# Patient Record
Sex: Male | Born: 1995 | Race: Black or African American | Hispanic: No | Marital: Single
Health system: Southern US, Community
[De-identification: ages and names within clinical notes are randomized; demographics above are authoritative.]

---
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
## 2021-12-29 ENCOUNTER — Ambulatory Visit: Payer: Self-pay | Admitting: Podiatry

## 2022-01-10 ENCOUNTER — Ambulatory Visit (INDEPENDENT_AMBULATORY_CARE_PROVIDER_SITE_OTHER): Payer: Medicaid Other | Admitting: Podiatry

## 2022-01-10 ENCOUNTER — Encounter: Payer: Self-pay | Admitting: Podiatry

## 2022-01-10 ENCOUNTER — Ambulatory Visit (INDEPENDENT_AMBULATORY_CARE_PROVIDER_SITE_OTHER): Payer: Medicaid Other

## 2022-01-10 DIAGNOSIS — M2011 Hallux valgus (acquired), right foot: Secondary | ICD-10-CM

## 2022-01-10 NOTE — Progress Notes (Signed)
? ?  Subjective: 26 y.o. male presents today as a new patient with his care provider, Arlys John, for evaluation of a symptomatic bunion to the right foot.  Patient recently moved from [facility], [place].  Apparently the patient was scheduled to have surgery prior to his move however when he moved he had to get reestablished here in [place].  He was referred from the PCP for evaluation of a symptomatic bunion deformity.  Presents for further treatment and evaluation ? ?No past medical history on file. ? ?No Known Allergies ? ?Objective: ?Physical Exam ?General: The patient is alert and oriented x3 in no acute distress. ? ?Dermatology: Skin is cool, dry and supple bilateral lower extremities. Negative for open lesions or macerations. ? ?Vascular: Palpable pedal pulses bilaterally. No edema or erythema noted. Capillary refill within normal limits. ? ?Neurological: Epicritic and protective threshold grossly intact bilaterally.  ? ?Musculoskeletal Exam: Clinical evidence of bunion deformity noted to the respective foot. There is moderate pain on palpation range of motion of the first MPJ. Lateral deviation of the hallux noted consistent with hallux abductovalgus. ? ?Radiographic Exam: Increased intermetatarsal angle greater than 15? with a hallux abductus angle greater than 30? noted on AP view. Moderate degenerative changes noted within the first MPJ. ? ?Assessment: ?1. HAV w/ bunion deformity right ? ? ?Plan of Care:  ?1. Patient was evaluated. X-Rays reviewed. ?2.  The patient has tried conservative treatment modalities including wider fitting shoes and arch supports with no improvement.  Conservative options have been exhausted and he was actually planning for surgery prior to his move however when he moved he had to reestablish care here in [place].   ?3.  Today we discussed the conservative versus surgical management of the presenting pathology. The patient and caregiver/guardian opts for surgical management. All  possible complications and details of the procedure were explained. All patient questions were answered. No guarantees were expressed or implied. ?4. Authorization for surgery was initiated today. Surgery will consist of bunionectomy with first metatarsal osteotomy right ?5.  Patient will require medical clearance from PCP ?6.  Return to clinic 1 week postop ? ?   ? ? ?Felecia Shelling, DPM ?[facility] ? ?Dr. Felecia Shelling, DPM  ?  ?[address]                                        ?[place], [place] [address]                ?Office [phone]  ?Fax [phone] ? ? ? ? ? ?

## 2022-01-27 ENCOUNTER — Encounter: Payer: Self-pay | Admitting: Podiatry

## 2022-01-27 ENCOUNTER — Other Ambulatory Visit: Payer: Self-pay | Admitting: Podiatry

## 2022-01-27 DIAGNOSIS — M2011 Hallux valgus (acquired), right foot: Secondary | ICD-10-CM | POA: Diagnosis not present

## 2022-01-27 MED ORDER — IBUPROFEN 800 MG PO TABS: 800.0000 mg | ORAL_TABLET | Freq: Three times a day (TID) | ORAL | 1 refills | Status: AC

## 2022-01-27 MED ORDER — OXYCODONE-ACETAMINOPHEN 5-325 MG PO TABS: 1.0000 | ORAL_TABLET | ORAL | 0 refills | Status: DC | PRN

## 2022-01-27 NOTE — Progress Notes (Signed)
PRN postop 

## 2022-02-02 ENCOUNTER — Ambulatory Visit (INDEPENDENT_AMBULATORY_CARE_PROVIDER_SITE_OTHER): Payer: Medicaid Other | Admitting: Podiatry

## 2022-02-02 ENCOUNTER — Ambulatory Visit (INDEPENDENT_AMBULATORY_CARE_PROVIDER_SITE_OTHER): Payer: Medicaid Other

## 2022-02-02 DIAGNOSIS — M2011 Hallux valgus (acquired), right foot: Secondary | ICD-10-CM | POA: Diagnosis not present

## 2022-02-02 DIAGNOSIS — Z9889 Other specified postprocedural states: Secondary | ICD-10-CM

## 2022-02-02 NOTE — Progress Notes (Signed)
   Subjective:  Patient presents today status post bunionectomy with first metatarsal osteotomy right. DOS: 01/27/2022.  Patient states that he is doing well.  Pain is controlled with the pain medication.  Patient's care provider today states that he is doing well although he does admit to excessively walking throughout the house.  No past medical history on file.  No Known Allergies   Objective/Physical Exam Neurovascular status intact.  Skin incisions appear to be well coapted with staples intact. No sign of infectious process noted. No dehiscence. No active bleeding noted. Moderate edema noted to the surgical extremity.  Radiographic Exam:  Orthopedic hardware and osteotomies sites appear to be stable with routine healing.  Assessment: 1. s/p bunionectomy with first metatarsal osteotomy right. DOS: 01/27/2022   Plan of Care:  1. Patient was evaluated. X-rays reviewed 2.  Dressings changed.  Clean dry and intact x1 week 3.  Continue minimal weightbearing in the cam boot.  Stressed the importance of reducing his activity and staying off of his foot is much as reasonably possible 4.  Continue Percocet 5/325 mg as needed pain 5.  Return to clinic 1 week for staple removal   Felecia Shelling, DPM [facility]  Dr. Felecia Shelling, DPM    [address] [place], [place] [address]                Office [phone]  Fax [phone]

## 2022-02-09 ENCOUNTER — Other Ambulatory Visit: Payer: Self-pay | Admitting: Podiatry

## 2022-02-09 ENCOUNTER — Encounter: Payer: Medicaid Other | Admitting: Podiatry

## 2022-02-09 NOTE — Telephone Encounter (Signed)
Please advise 

## 2022-02-10 MED ORDER — OXYCODONE-ACETAMINOPHEN 5-325 MG PO TABS: 1.0000 | ORAL_TABLET | ORAL | 0 refills | Status: DC | PRN

## 2022-02-11 ENCOUNTER — Other Ambulatory Visit: Payer: Self-pay | Admitting: Podiatry

## 2022-02-11 MED ORDER — OXYCODONE-ACETAMINOPHEN 5-325 MG PO TABS: 1.0000 | ORAL_TABLET | Freq: Four times a day (QID) | ORAL | 0 refills | Status: AC | PRN

## 2022-02-23 ENCOUNTER — Ambulatory Visit (INDEPENDENT_AMBULATORY_CARE_PROVIDER_SITE_OTHER): Payer: Medicaid Other | Admitting: Podiatry

## 2022-02-23 ENCOUNTER — Ambulatory Visit (INDEPENDENT_AMBULATORY_CARE_PROVIDER_SITE_OTHER): Payer: Medicaid Other

## 2022-02-23 DIAGNOSIS — M2011 Hallux valgus (acquired), right foot: Secondary | ICD-10-CM | POA: Diagnosis not present

## 2022-02-23 DIAGNOSIS — Z9889 Other specified postprocedural states: Secondary | ICD-10-CM

## 2022-02-23 NOTE — Progress Notes (Signed)
   Subjective:  Patient presents today status post bunionectomy with first metatarsal osteotomy right. DOS: 01/27/2022.  Patient doing well.  He has been wearing the cam boot as instructed.  No new complaints at this time  No past medical history on file.  No Known Allergies   Objective/Physical Exam Neurovascular status intact.  Skin incisions appear to be well coapted with staples intact. No sign of infectious process noted. No dehiscence. No active bleeding noted.  No edema.  Clinically the toe is in a good rectus position  Radiographic Exam:  There does appear to have been some translocation and movement of the screws within the first metatarsal however there continues to be good alignment of the first ray.  Assessment: 1. s/p bunionectomy with first metatarsal osteotomy right. DOS: 01/27/2022   Plan of Care:  1. Patient was evaluated. X-rays reviewed.  We will keep the patient in the cam boot for an additional 3 weeks 2.  Staples removed.  Patient may begin washing and showering getting the foot wet 3.  Continue weightbearing in the cam boot for 3 additional weeks 4.  Return to clinic in 3 weeks for follow-up x-ray and to transition the patient out of the surgical boot into good supportive sneakers and shoes 5.  Return to clinic 3 weeks for follow-up x-ray   Felecia Shelling, DPM [facility]  Dr. Felecia Shelling, DPM    [address] [place], [place] [address]                Office [phone]  Fax [phone]

## 2022-03-16 ENCOUNTER — Ambulatory Visit (INDEPENDENT_AMBULATORY_CARE_PROVIDER_SITE_OTHER): Payer: Medicaid Other | Admitting: Podiatry

## 2022-03-16 ENCOUNTER — Ambulatory Visit (INDEPENDENT_AMBULATORY_CARE_PROVIDER_SITE_OTHER): Payer: Medicaid Other

## 2022-03-16 DIAGNOSIS — M2011 Hallux valgus (acquired), right foot: Secondary | ICD-10-CM

## 2022-03-16 DIAGNOSIS — Z9889 Other specified postprocedural states: Secondary | ICD-10-CM

## 2022-03-16 NOTE — Progress Notes (Signed)
   Subjective:  Patient presents today status post bunionectomy with first metatarsal osteotomy right. DOS: 01/27/2022.  Patient continues to do well.  He says that he has been weightbearing in the cam boot.  No new complaints at this time.  He presents today with his caregiver  No past medical history on file.  No Known Allergies   Objective/Physical Exam Neurovascular status intact.  Skin incisions nicely healed.  No edema.  Clinically the toe is in a good rectus position.  The patient has no pain associated to the surgical foot.  Range of motion within normal.  Radiographic Exam:  There continues to be some slight fragmentation of the osteotomy site and shortening of the first metatarsal in general.  Slight elevatus also noted on lateral view of the first metatarsal osteotomy site.  Assessment: 1. s/p bunionectomy with first metatarsal osteotomy right. DOS: 01/27/2022   Plan of Care:  1. Patient was evaluated. X-rays reviewed.   2.  The patient is now almost 2 months postop.  He may transition out of the cam boot into good supportive shoes and sneakers. 3.  Continue reduced activity and minimal ambulating 4.  Return to clinic in 4 weeks for final follow-up x-ray  Felecia Shelling, DPM [facility]  Dr. Felecia Shelling, DPM    [address] [place], [place] [address]                Office [phone]  Fax [phone]

## 2022-04-27 ENCOUNTER — Ambulatory Visit (INDEPENDENT_AMBULATORY_CARE_PROVIDER_SITE_OTHER): Payer: Medicaid Other

## 2022-04-27 ENCOUNTER — Ambulatory Visit (INDEPENDENT_AMBULATORY_CARE_PROVIDER_SITE_OTHER): Payer: Medicaid Other | Admitting: Podiatry

## 2022-04-27 DIAGNOSIS — M2011 Hallux valgus (acquired), right foot: Secondary | ICD-10-CM | POA: Diagnosis not present

## 2022-04-27 NOTE — Progress Notes (Signed)
   Chief Complaint  Patient presents with   Post-op Follow-up     POV #5 DOS 01/27/2022 BUNIONECTOMY W/OSTEOTOMY RT    Subjective:  Patient presents today status post bunionectomy with first metatarsal osteotomy right. DOS: 01/27/2022.  Patient states that he is doing very well.  He is ambulating just fine in tennis shoes with no  No past medical history on file.  No Known Allergies   Objective/Physical Exam Neurovascular status intact.  Skin incisions nicely healed.  No edema.  Clinically the toe is in a good rectus position.  The patient continues to state that he has no associated pain to the surgical foot.  He has been full activity no restrictions in tennis shoes with no complications or symptoms  Radiographic Exam RT foot 04/27/2022:  Mostly unchanged since prior x-rays taken there continues to be fragmentation of the osteotomy site and shortening of the first metatarsal in general.  Slight elevatus also noted on lateral view of the first metatarsal osteotomy site.  Assessment: 1. s/p bunionectomy with first metatarsal osteotomy right. DOS: 01/27/2022   Plan of Care:  1. Patient was evaluated. X-rays reviewed.   2.  Again, the patient states that he has no pain or tenderness associated to the surgical foot.  He has been walking in good supportive sneakers in tennis shoes without any complications or pain or swelling. 3.  The patient is now over 3 months postop.  He may continue wearing supportive tennis shoes and sneakers 4.  We will continue to observe for now.  Return to clinic as needed, or if he begins to notice swelling or symptoms to the surgical foot  Felecia Shelling, DPM [facility]  Dr. Felecia Shelling, DPM    [address] [place], [place] [address]                Office [phone]  Fax [phone]

## 2022-05-26 ENCOUNTER — Emergency Department (HOSPITAL_COMMUNITY): Payer: No Typology Code available for payment source

## 2022-05-26 ENCOUNTER — Encounter (HOSPITAL_COMMUNITY): Payer: Self-pay

## 2022-05-26 ENCOUNTER — Emergency Department (HOSPITAL_COMMUNITY)
Admission: EM | Admit: 2022-05-26 | Discharge: 2022-05-30 | Disposition: A | Discharge status: Home / Self Care | Payer: No Typology Code available for payment source | Attending: Emergency Medicine | Admitting: Emergency Medicine

## 2022-05-26 ENCOUNTER — Other Ambulatory Visit: Payer: Self-pay

## 2022-05-26 DIAGNOSIS — R45850 Homicidal ideations: Secondary | ICD-10-CM | POA: Diagnosis not present

## 2022-05-26 DIAGNOSIS — Z20822 Contact with and (suspected) exposure to covid-19: Secondary | ICD-10-CM | POA: Insufficient documentation

## 2022-05-26 DIAGNOSIS — R7989 Other specified abnormal findings of blood chemistry: Secondary | ICD-10-CM | POA: Diagnosis not present

## 2022-05-26 DIAGNOSIS — F4310 Post-traumatic stress disorder, unspecified: Secondary | ICD-10-CM | POA: Diagnosis not present

## 2022-05-26 DIAGNOSIS — D649 Anemia, unspecified: Secondary | ICD-10-CM | POA: Diagnosis not present

## 2022-05-26 DIAGNOSIS — M549 Dorsalgia, unspecified: Secondary | ICD-10-CM | POA: Insufficient documentation

## 2022-05-26 DIAGNOSIS — F250 Schizoaffective disorder, bipolar type: Secondary | ICD-10-CM | POA: Diagnosis present

## 2022-05-26 LAB — CBC WITH DIFFERENTIAL/PLATELET
Abs Immature Granulocytes: 0.02 10*3/uL (ref 0.00–0.07)
Basophils Absolute: 0 10*3/uL (ref 0.0–0.1)
Basophils Relative: 0 %
Eosinophils Absolute: 0 10*3/uL (ref 0.0–0.5)
Eosinophils Relative: 0 %
HCT: 35.8 % — ABNORMAL LOW (ref 39.0–52.0)
Hemoglobin: 10.6 g/dL — ABNORMAL LOW (ref 13.0–17.0)
Immature Granulocytes: 0 %
Lymphocytes Relative: 13 %
Lymphs Abs: 1.4 10*3/uL (ref 0.7–4.0)
MCH: 26.4 pg (ref 26.0–34.0)
MCHC: 29.6 g/dL — ABNORMAL LOW (ref 30.0–36.0)
MCV: 89.1 fL (ref 80.0–100.0)
Monocytes Absolute: 0.9 10*3/uL (ref 0.1–1.0)
Monocytes Relative: 8 %
Neutro Abs: 8.1 10*3/uL — ABNORMAL HIGH (ref 1.7–7.7)
Neutrophils Relative %: 79 %
Platelets: 338 10*3/uL (ref 150–400)
RBC: 4.02 MIL/uL — ABNORMAL LOW (ref 4.22–5.81)
RDW: 15.3 % (ref 11.5–15.5)
WBC: 10.4 10*3/uL (ref 4.0–10.5)
nRBC: 0 % (ref 0.0–0.2)

## 2022-05-26 LAB — SALICYLATE LEVEL: Salicylate Lvl: <7 mg/dL — ABNORMAL LOW (ref 7.0–30.0)

## 2022-05-26 LAB — ETHANOL: Alcohol, Ethyl (B): <10 mg/dL (ref <10)

## 2022-05-26 LAB — ACETAMINOPHEN LEVEL: Acetaminophen (Tylenol), Serum: <10 ug/mL — ABNORMAL LOW (ref 10–30)

## 2022-05-26 NOTE — ED Triage Notes (Addendum)
BIB [employer] as a Marcus Conley. PD states he was at [place] and was saying he had a bomb and was going to kill himself and other people. Won't tell anyone what is name is, where he lives. 

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
## 2022-05-27 DIAGNOSIS — F4310 Post-traumatic stress disorder, unspecified: Secondary | ICD-10-CM | POA: Diagnosis present

## 2022-05-27 DIAGNOSIS — F250 Schizoaffective disorder, bipolar type: Secondary | ICD-10-CM | POA: Diagnosis present

## 2022-05-27 LAB — COMPREHENSIVE METABOLIC PANEL
ALT: 18 U/L (ref 0–44)
AST: 28 U/L (ref 15–41)
Albumin: 4.3 g/dL (ref 3.5–5.0)
Alkaline Phosphatase: 74 U/L (ref 38–126)
Anion gap: 6 (ref 5–15)
BUN: 12 mg/dL (ref 6–20)
CO2: 26 mmol/L (ref 22–32)
Calcium: 9.8 mg/dL (ref 8.9–10.3)
Chloride: 109 mmol/L (ref 98–111)
Creatinine, Ser: 1.5 mg/dL — ABNORMAL HIGH (ref 0.61–1.24)
GFR, Estimated: 31 mL/min — ABNORMAL LOW (ref >60)
Glucose, Bld: 121 mg/dL — ABNORMAL HIGH (ref 70–99)
Potassium: 3.5 mmol/L (ref 3.5–5.1)
Sodium: 141 mmol/L (ref 135–145)
Total Bilirubin: 0.3 mg/dL (ref 0.3–1.2)
Total Protein: 7.2 g/dL (ref 6.5–8.1)

## 2022-05-27 LAB — RAPID URINE DRUG SCREEN, HOSP PERFORMED
Amphetamines: NOT DETECTED
Barbiturates: NOT DETECTED
Benzodiazepines: NOT DETECTED
Cocaine: NOT DETECTED
Opiates: NOT DETECTED
Tetrahydrocannabinol: NOT DETECTED

## 2022-05-27 LAB — RESP PANEL BY RT-PCR (FLU A&B, COVID) ARPGX2
Influenza A by PCR: NEGATIVE
Influenza B by PCR: NEGATIVE
SARS Coronavirus 2 by RT PCR: NEGATIVE

## 2022-05-27 LAB — LITHIUM LEVEL: Lithium Lvl: 0.7 mmol/L (ref 0.60–1.20)

## 2022-05-27 MED ORDER — LITHIUM CARBONATE 300 MG PO CAPS
600.0000 mg | ORAL_CAPSULE | Freq: Two times a day (BID) | ORAL | Status: DC
  Administered 2022-05-28 – 2022-05-30 (×6): 600 mg via ORAL
  Filled 2022-05-27 (×6): qty 2

## 2022-05-27 MED ORDER — ZIPRASIDONE MESYLATE 20 MG IM SOLR
20.0000 mg | Freq: Once | INTRAMUSCULAR | Status: AC | PRN
  Administered 2022-05-27: 20 mg via INTRAMUSCULAR
  Filled 2022-05-27: qty 20

## 2022-05-27 MED ORDER — ACETAMINOPHEN 500 MG PO TABS
1000.0000 mg | ORAL_TABLET | Freq: Once | ORAL | Status: AC
  Administered 2022-05-27: 1000 mg via ORAL
  Filled 2022-05-27: qty 2

## 2022-05-27 MED ORDER — HALOPERIDOL 5 MG PO TABS
10.0000 mg | ORAL_TABLET | Freq: Two times a day (BID) | ORAL | Status: DC
  Administered 2022-05-27 – 2022-05-30 (×7): 10 mg via ORAL
  Filled 2022-05-27 (×7): qty 2

## 2022-05-27 MED ORDER — ACETAMINOPHEN 325 MG PO TABS
650.0000 mg | ORAL_TABLET | Freq: Four times a day (QID) | ORAL | Status: DC | PRN
  Administered 2022-05-27: 650 mg via ORAL
  Filled 2022-05-27: qty 2

## 2022-05-27 MED ORDER — OLANZAPINE 10 MG PO TABS
10.0000 mg | ORAL_TABLET | Freq: Two times a day (BID) | ORAL | Status: DC
  Administered 2022-05-28 – 2022-05-30 (×6): 10 mg via ORAL
  Filled 2022-05-27 (×6): qty 1

## 2022-05-27 MED ORDER — BENZTROPINE MESYLATE 0.5 MG PO TABS
1.0000 mg | ORAL_TABLET | Freq: Every day | ORAL | Status: DC
  Administered 2022-05-27 – 2022-05-30 (×5): 1 mg via ORAL
  Filled 2022-05-27 (×5): qty 2

## 2022-05-27 MED ORDER — HALOPERIDOL 5 MG PO TABS
5.0000 mg | ORAL_TABLET | Freq: Every day | ORAL | Status: DC
  Filled 2022-05-27 (×2): qty 1

## 2022-05-27 MED ORDER — OLANZAPINE 5 MG PO TABS
5.0000 mg | ORAL_TABLET | Freq: Every day | ORAL | Status: DC
  Administered 2022-05-28 – 2022-05-30 (×3): 5 mg via ORAL
  Filled 2022-05-27 (×3): qty 1

## 2022-05-27 NOTE — ED Notes (Signed)
Gave patient tylenol for lower back pain. Will not give his name.
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
## 2022-05-27 NOTE — Progress Notes (Signed)
TOC CSW received a call from Mannie Stabile with [place] DSS-[facility].  CSW gave report.    [place] Tarpley-Carter, MSW, LCSW-A Pronouns:  She/Her/Hers [facility] HealthTransitions of Care Clinical Social Worker Direct Number:  [phone] [email or web address]

## 2022-05-27 NOTE — ED Notes (Signed)
Pt reading his BP cuff like a book. Refusing to tell this RN his name.

## 2022-05-27 NOTE — Progress Notes (Signed)
Received patient's MAR from [employer]. Medications reviewed and entered into PTA meds.  Continue home medications -lithium 600 mg BID for mood stability -haldol 15 mg oral QHS and 10 mg daily for schizoaffective disorder -olanzapine 10 mg BID and 5 mg daily at noon for schizoaffective disorder -benztropine 1 mg oral daily for EPS prophylaxis  Placed orders for lithium level and EKG

## 2022-05-27 NOTE — ED Notes (Signed)
Pt has been persistently wandering the hallway, not staying in his bed, expressed he will try to leave. Pt presents to be hyperactive, walking repeatedly up to nursing station and up and down hall, saying he will try to leave.
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
## 2022-05-28 DIAGNOSIS — F250 Schizoaffective disorder, bipolar type: Secondary | ICD-10-CM

## 2022-05-28 MED ORDER — AMMONIA AROMATIC IN INHA
RESPIRATORY_TRACT | Status: AC
  Filled 2022-05-28: qty 10
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
## 2022-05-28 NOTE — ED Notes (Signed)
Pt sleeping will give meds upon pt waking

## 2022-05-28 NOTE — ED Notes (Signed)
Benztropine administration. Per pharmacy. No additional monitoring or treatment changes. No need to consult poison control. Attending ED provider aware.
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
## 2022-05-28 NOTE — ED Notes (Signed)
Pt taken to TCU for shower
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
## 2022-05-29 NOTE — Discharge Instructions (Addendum)
  Discharge recommendations:  Patient is to take medications as prescribed. Please see information for follow-up appointment with psychiatry and therapy. Please follow up with your primary care provider for all medical related needs.   Therapy: We recommend that patient participate in individual therapy to address mental health concerns.  Medications: The guardian is to contact a medical professional and/or outpatient provider to address any new side effects that develop. guardian should update outpatient providers of any new medications and/or medication changes.   Atypical antipsychotics: If you are prescribed an atypical antipsychotic, it is recommended that your height, weight, BMI, blood pressure, fasting lipid panel, and fasting blood sugar be monitored by your outpatient providers.  Safety:  The patient should abstain from use of illicit substances/drugs and abuse of any medications. If symptoms worsen or do not continue to improve or if the patient becomes actively suicidal or homicidal then it is recommended that the patient return to the closest hospital emergency department, the [facility], or call 911 for further evaluation and treatment. National Suicide Prevention Lifeline 1-800-SUICIDE or 518-842-1767.  About 988 988 offers 24/7 access to trained crisis counselors who can help people experiencing mental health-related distress. People can call or text 988 or chat 988lifeline.org for themselves or if they are worried about a loved one who may need crisis support.  Crisis Mobile: [facility]:                     407-134-8213 (for crisis response 24 hours a day) [place]:                                            [phone]
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
## 2022-05-30 NOTE — ED Provider Notes (Signed)
Emergency Medicine Observation Re-evaluation Note  Marcus Conley is a 26 y.o. male, seen on rounds today.  Pt initially presented to the ED for complaints of Medical Clearance Currently, the patient is eating breakfast.  Physical Exam  BP 126/60 (BP Location: Right Arm)   Pulse 67   Temp (!) 97.4 F (36.3 C) (Oral)   Resp 16   SpO2 100%  Physical Exam General: Awake, alert Cardiac: Extremities well-perfused Lungs: Breathing is unlabored Psych: No agitation  ED Course / MDM  EKG:   I have reviewed the labs performed to date as well as medications administered while in observation.  Recent changes in the last 24 hours include none.  Plan  Current plan is for social work dispo.    Godfrey Pick, MD 05/30/22 [phone]
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
## 2022-05-31 ENCOUNTER — Encounter: Payer: Self-pay | Admitting: Podiatry

## 2023-02-14 ENCOUNTER — Ambulatory Visit (HOSPITAL_COMMUNITY)
Admission: EM | Admit: 2023-02-14 | Discharge: 2023-02-14 | Disposition: A | Discharge status: Home / Self Care | Payer: Medicaid Other | Attending: Psychiatry | Admitting: Psychiatry

## 2023-02-14 DIAGNOSIS — F79 Unspecified intellectual disabilities: Secondary | ICD-10-CM | POA: Insufficient documentation

## 2023-02-14 DIAGNOSIS — F250 Schizoaffective disorder, bipolar type: Secondary | ICD-10-CM | POA: Insufficient documentation

## 2023-02-14 DIAGNOSIS — R4689 Other symptoms and signs involving appearance and behavior: Secondary | ICD-10-CM

## 2023-02-14 DIAGNOSIS — F17210 Nicotine dependence, cigarettes, uncomplicated: Secondary | ICD-10-CM | POA: Insufficient documentation

## 2023-02-14 DIAGNOSIS — F4310 Post-traumatic stress disorder, unspecified: Secondary | ICD-10-CM | POA: Insufficient documentation
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
## 2023-02-14 NOTE — Discharge Instructions (Signed)
F/u with [employer]
